# Patient Record
Sex: Female | Born: 1970
Health system: Southern US, Community
[De-identification: ages and names within clinical notes are randomized; demographics above are authoritative.]

---

## 2019-11-07 ENCOUNTER — Encounter (HOSPITAL_COMMUNITY): Payer: Self-pay | Admitting: Emergency Medicine

## 2019-11-07 ENCOUNTER — Other Ambulatory Visit: Payer: Self-pay

## 2019-11-07 ENCOUNTER — Emergency Department (HOSPITAL_COMMUNITY): Payer: Self-pay

## 2019-11-07 ENCOUNTER — Emergency Department (HOSPITAL_COMMUNITY)
Admission: EM | Admit: 2019-11-07 | Discharge: 2019-11-07 | Disposition: A | Discharge status: Home / Self Care | Payer: Self-pay | Attending: Emergency Medicine | Admitting: Emergency Medicine

## 2019-11-07 DIAGNOSIS — R55 Syncope and collapse: Secondary | ICD-10-CM | POA: Insufficient documentation

## 2019-11-07 DIAGNOSIS — R079 Chest pain, unspecified: Secondary | ICD-10-CM

## 2019-11-07 DIAGNOSIS — R002 Palpitations: Secondary | ICD-10-CM | POA: Insufficient documentation

## 2019-11-07 LAB — CBC
HCT: 42.7 % (ref 36.0–46.0)
Hemoglobin: 13.9 g/dL (ref 12.0–15.0)
MCH: 29 pg (ref 26.0–34.0)
MCHC: 32.6 g/dL (ref 30.0–36.0)
MCV: 89.1 fL (ref 80.0–100.0)
Platelets: 280 10*3/uL (ref 150–400)
RBC: 4.79 MIL/uL (ref 3.87–5.11)
RDW: 11.9 % (ref 11.5–15.5)
WBC: 6.7 10*3/uL (ref 4.0–10.5)
nRBC: 0 % (ref 0.0–0.2)

## 2019-11-07 LAB — D-DIMER, QUANTITATIVE: D-Dimer, Quant: 0.3 ug/mL-FEU (ref 0.00–0.50)

## 2019-11-07 LAB — I-STAT BETA HCG BLOOD, ED (MC, WL, AP ONLY): I-stat hCG, quantitative: <5 m[IU]/mL (ref <5)

## 2019-11-07 LAB — BASIC METABOLIC PANEL
Anion gap: 10 (ref 5–15)
BUN: 6 mg/dL (ref 6–20)
CO2: 25 mmol/L (ref 22–32)
Calcium: 9.4 mg/dL (ref 8.9–10.3)
Chloride: 106 mmol/L (ref 98–111)
Creatinine, Ser: 0.68 mg/dL (ref 0.44–1.00)
GFR calc Af Amer: >60 mL/min (ref >60)
GFR calc non Af Amer: >60 mL/min (ref >60)
Glucose, Bld: 105 mg/dL — ABNORMAL HIGH (ref 70–99)
Potassium: 4.6 mmol/L (ref 3.5–5.1)
Sodium: 141 mmol/L (ref 135–145)

## 2019-11-07 LAB — URINALYSIS, ROUTINE W REFLEX MICROSCOPIC
Bilirubin Urine: NEGATIVE
Glucose, UA: NEGATIVE mg/dL
Hgb urine dipstick: NEGATIVE
Ketones, ur: NEGATIVE mg/dL
Leukocytes,Ua: NEGATIVE
Nitrite: NEGATIVE
Protein, ur: NEGATIVE mg/dL
Specific Gravity, Urine: 1.003 — ABNORMAL LOW (ref 1.005–1.030)
pH: 7 (ref 5.0–8.0)

## 2019-11-07 LAB — TROPONIN I (HIGH SENSITIVITY): Troponin I (High Sensitivity): 2 ng/L (ref <18)

## 2019-11-07 LAB — CBG MONITORING, ED: Glucose-Capillary: 90 mg/dL (ref 70–99)

## 2019-11-07 MED ORDER — SODIUM CHLORIDE 0.9% FLUSH: 3.0000 mL | Freq: Once | INTRAVENOUS | Status: DC

## 2019-11-07 NOTE — ED Notes (Signed)
Patient transported to X-ray 

## 2019-11-07 NOTE — ED Provider Notes (Signed)
[facility] EMERGENCY DEPARTMENT Provider Note   CSN: [id number] Arrival date & time: 11/07/19  1213     History Chief Complaint  Patient presents with  . Chest Pain  . syncopal episode    Elizabeth Solis is a 49 y.o. female who presents to the ED today complaining of gradual onset, intermittent, feelings of heart fluttering/"shaking in my chest." x 1 day. Pt also reports near syncopal episode today however triage report states full syncopal episode -reports she was in the shower when she felt suddenly lightheaded sat down on the floor.  She denies head injury or loss of consciousness.  She does not think she fully lost consciousness as she thinks she made a conscious decision to sit down versus she feels like if she actually passed out she would have gone through the shower door and broke the glass.  She reports that she was concerned with her near syncopal episode today and she does not want her husband to be concerned.  He states that when she feels like her heart is fluttering she feels like her bilateral arms go "cold" over she denies any tingling, numbness, weakness in her arms.  No one sided weakness.  Denies headache, vision changes, shortness of breath, diaphoresis, leg swelling, nausea, vomiting, any other associated symptoms.  History of DVT/PE.  No recent prolonged travel or immobilization.  No active malignancy.  No hemoptysis.  No exogenous hormone use.  Does report she has a history of Hashimoto's disease however has been off of her medications for some time as her pharmacy stopped caring the specific medication.   The history is provided by the patient.       History reviewed. No pertinent past medical history.  There are no problems to display for this patient.   History reviewed. No pertinent surgical history.   OB History   No obstetric history on file.     No family history on file.  Social History   Tobacco Use  . Smoking status: Never  Smoker  . Smokeless tobacco: Never Used  Substance Use Topics  . Alcohol use: Not Currently  . Drug use: Never    Home Medications Prior to Admission medications   Medication Sig Start Date End Date Taking? Authorizing Provider  Multiple Vitamin (MULTIVITAMIN) LIQD Take 30 mLs by mouth daily.   Yes [provider]  Pyrantel Pamoate ([facility] PINWORM TREATMENT PO) Take 1 tablet by mouth once.   Yes [provider]    Allergies    Miconazole  Review of Systems   Review of Systems  Constitutional: Negative for chills and fever.  Respiratory: Negative for cough and shortness of breath.   Cardiovascular: Positive for palpitations.  Gastrointestinal: Negative for abdominal pain, constipation, diarrhea, nausea and vomiting.  Neurological: Negative for dizziness, syncope, weakness, light-headedness and numbness.  All other systems reviewed and are negative.   Physical Exam Updated Vital Signs BP 112/72   Pulse 76   Temp 98.7 F (37.1 C) (Oral)   Resp 17   Ht 5\' 8"  (1.727 m)   Wt 88.5 kg   LMP 11/01/2019   SpO2 97%   BMI 29.65 kg/m   Physical Exam Vitals and nursing note reviewed.  Constitutional:      Appearance: She is not ill-appearing.  HENT:     Head: Normocephalic and atraumatic.  Eyes:     Extraocular Movements: Extraocular movements intact.     Conjunctiva/sclera: Conjunctivae normal.     Pupils: Pupils  are equal, round, and reactive to light.  Cardiovascular:     Rate and Rhythm: Normal rate and regular rhythm.     Pulses:          Radial pulses are 2+ on the right side and 2+ on the left side.       Dorsalis pedis pulses are 2+ on the right side and 2+ on the left side.  Pulmonary:     Effort: Pulmonary effort is normal.     Breath sounds: Normal breath sounds. No decreased breath sounds, wheezing, rhonchi or rales.  Chest:     Chest wall: No tenderness.  Abdominal:     Palpations: Abdomen is soft.     Tenderness: There is no abdominal  tenderness. There is no guarding or rebound.  Musculoskeletal:     Cervical back: Neck supple.     Right lower leg: No edema.     Left lower leg: No edema.  Skin:    General: Skin is warm and dry.  Neurological:     Mental Status: She is alert.     Comments: CN 3-12 grossly intact A&O x4 GCS 15 Sensation and strength intact Gait nonataxic including with tandem walking Coordination with finger-to-nose WNL Neg romberg, neg pronator drift     ED Results / Procedures / Treatments   Labs (all labs ordered are listed, but only abnormal results are displayed) Labs Reviewed  BASIC METABOLIC PANEL - Abnormal; Notable for the following components:      Result Value   Glucose, Bld 105 (*)    All other components within normal limits  URINALYSIS, ROUTINE W REFLEX MICROSCOPIC - Abnormal; Notable for the following components:   Color, Urine STRAW (*)    Specific Gravity, Urine 1.003 (*)    All other components within normal limits  CBC  D-DIMER, QUANTITATIVE (NOT AT [facility])  I-STAT BETA HCG BLOOD, ED ([facility], [facility], [facility] ONLY)  CBG MONITORING, ED  TROPONIN I (HIGH SENSITIVITY)    EKG EKG Interpretation  Date/Time:  Sunday November 07 2019 12:34:09 EST Ventricular Rate:  86 PR Interval:  146 QRS Duration: 70 QT Interval:  382 QTC Calculation: 457 R Axis:   77 Text Interpretation: Normal sinus rhythm Normal ECG Confirmed by Cathren Laine ([id number]) on 11/07/2019 12:46:31 PM   Radiology DG Chest 2 View  Result Date: 11/07/2019 CLINICAL DATA:  Chest pain EXAM: CHEST - 2 VIEW COMPARISON:  None. FINDINGS: The heart size and mediastinal contours are within normal limits. Both lungs are clear. The visualized skeletal structures are unremarkable. IMPRESSION: No active cardiopulmonary disease. Electronically Signed   By: Alcide Clever M.D.   On: 11/07/2019 13:23    Procedures Procedures (including critical care time)  Medications Ordered in ED Medications  sodium chloride flush (NS) 0.9 %  injection 3 mL (3 mLs Intravenous Not Given 11/07/19 1558)    ED Course  I have reviewed the triage vital signs and the nursing notes.  Pertinent labs & imaging results that were available during my care of the patient were reviewed by me and considered in my medical decision making (see chart for details).  Clinical Course as of Nov 06 1601  Sun Nov 07, 2019  1331 DG Chest 2 View [MV]  1410 D-Dimer, Quant: 0.30 [MV]    Clinical Course User Index [MV] Tanda Rockers, [place]   49 year old female who presents to the ED today complaining of feelings of heart fluttering lately for the past day and also a near syncopal  episode today which concerned her.  On arrival to the ED patient is afebrile, nontachycardic and nontachypneic.  She is denying active chest pain.  No shortness of breath or other concerning symptoms for ACS.  Patient resting comfortably in the room however states that she feels like her heart is beating fast despite heart rate in the 70s and 80s.  Has no significant risk factors for PE.  Focal neuro deficits on exam today.  Will work up for heart fluttering etiologies including electrolyte disturbances, EKG obtained, will work up for ACS. D dimer added given complaint of palpitations. Will obtain orthostatics as well.   EKG without ischemic changes today.  CXR negative.  CBC without leukocytosis. Hgb stable.  BMP without electrolyte abnormalities. Creatinine within normal limits.  Troponin of 2; given no complaint of chest pain and low suspicion for ACS do not feel pt needs repeat troponin  D dimer negative.  U/A without infection.   Orthostatics within normal limits.   Labwork reassuring today. During ED visit pt does mention that she has a hx of hashimoto's but has been off of medicines for quite some time as her pharmacy did not carry her medicine anymore and she does not like taking medications on a daily basis. This could certainly be causing pt's symptoms. I would like her  to follow up with cardiology given complaints, may benefit from holter monitor. Will place ambulatory referral. Have advised to follow up with her PCP as well. [facility] information given. Pt encouraged to increase oral intake as she may have some aspect of dehydration as well although cannot rule out cardiac arythmias playing a part which is why she needs to follow up with cardiology. Strict return precautions discussed. Pt is in agreement with plan and stable for discharge home.   This note was prepared using Dragon voice recognition software and may include unintentional dictation errors due to the inherent limitations of voice recognition software.   MDM Rules/Calculators/A&P                       Final Clinical Impression(s) / ED Diagnoses Final diagnoses:  Heart palpitations  Nonspecific chest pain  Near syncope    Rx / DC Orders ED Discharge Orders         Ordered    Ambulatory referral to Cardiology     11/07/19 1548           Discharge Instructions     Your labwork, EKG, and chest xray were very reassuring today  I have placed a referral to cardiology for you - they will call to schedule an appointment.   Please follow up with your PCP as well; if you dont have one you can follow up with [facility] for primary care needs. They may need to check your thyroid levels given you have been off of your medications and check some other labwork as well   Return to the ED for any worsening symptoms including worsening palpitations, feeling you can't breathe/shortness of breath, recurrent chest pain, vomiting, continuously passing out, blurry vision/double vision, numbness or weakness on one side of your body, or any other concerning symptoms.        Eustaquio Maize, PA-C 11/07/19 1609    Lajean Saver, MD 11/08/19 [phone]

## 2019-11-07 NOTE — Discharge Instructions (Signed)
Your labwork, EKG, and chest xray were very reassuring today  I have placed a referral to cardiology for you - they will call to schedule an appointment.   Please follow up with your PCP as well; if you dont have one you can follow up with [facility] for primary care needs. They may need to check your thyroid levels given you have been off of your medications and check some other labwork as well   Return to the ED for any worsening symptoms including worsening palpitations, feeling you can't breathe/shortness of breath, recurrent chest pain, vomiting, continuously passing out, blurry vision/double vision, numbness or weakness on one side of your body, or any other concerning symptoms.

## 2019-11-07 NOTE — ED Notes (Signed)
Pt reports taking medication for pinworms approx. 1-2 weeks ago, inquiring if this could cause symptoms. Provider made aware.

## 2019-11-07 NOTE — ED Triage Notes (Addendum)
Pt states "it feels like someone on my heart shaking it" intermittently since yesterday.  States it is worse today.  Pt tearful and states she had syncopal episode while in shower today and "passed out" for a few minutes.  Pt states she does not want her husband to know about it.  Reports tightness to center of back that started on the way to the hospital.  Also reports stiffness to neck.  Pt refuses c-collar at triage.

## 2019-11-22 NOTE — Progress Notes (Deleted)
    Cardiology Office Note  Date: 11/22/2019   ID: Elizabeth Solis, DOB 1970/10/04, MRN [id number]  PCP:  Patient, No Pcp Per  Consulting Cardiologist: Jonelle Sidle, MD Electrophysiologist:  None   No chief complaint on file.   History of Present Illness: Elizabeth Solis is a 49 y.o. female referred for card iology consultation by Ms. Hyman Hopes PA-C for the evaluation of palpitations.  I reviewed the ER notes from February 14.  Work-up was unrevealing for cardiac arrhythmia.  She had normal D-dimer and high-sensitivity troponin I levels, chest x-ray unremarkable, no arrhythmia by telemetry or ECG, orthostatics negative.  No past medical history on file.  No past surgical history on file.  Current Outpatient Medications  Medication Sig Dispense Refill  . Multiple Vitamin (MULTIVITAMIN) LIQD Take 30 mLs by mouth daily.    . Pyrantel Pamoate ([facility] PINWORM TREATMENT PO) Take 1 tablet by mouth once.     No current facility-administered medications for this visit.   Allergies:  Miconazole   Social History: The patient  reports that she has never smoked. She has never used smokeless tobacco. She reports previous alcohol use. She reports that she does not use drugs.   Family History: The patient's family history is not on file.   ROS:  Please see the history of present illness. Otherwise, complete review of systems is positive for {NONE DEFAULTED:18576::"none"}.  All other systems are reviewed and negative.   Physical Exam: VS:  LMP 11/01/2019 , BMI There is no height or weight on file to calculate BMI.  Wt Readings from Last 3 Encounters:  11/07/19 195 lb (88.5 kg)    General: Patient appears comfortable at rest. HEENT: Conjunctiva and lids normal, oropharynx clear with moist mucosa. Neck: Supple, no elevated JVP or carotid bruits, no thyromegaly. Lungs: Clear to auscultation, nonlabored breathing at rest. Cardiac: Regular rate and rhythm, no S3 or significant systolic  murmur, no pericardial rub. Abdomen: Soft, nontender, no hepatomegaly, bowel sounds present, no guarding or rebound. Extremities: No pitting edema, distal pulses 2+. Skin: Warm and dry. Musculoskeletal: No kyphosis. Neuropsychiatric: Alert and oriented x3, affect grossly appropriate.  ECG:  An ECG dated 11/07/2019 was personally reviewed today and demonstrated:  Normal sinus rhythm.  Recent Labwork: 11/07/2019: BUN 6; Creatinine, Ser 0.68; Hemoglobin 13.9; Platelets 280; Potassium 4.6; Sodium 141, high-sensitivity troponin I 2, D-dimer 0.30, hCG negative  Other Studies Reviewed Today:  Chest x-ray 11/07/2019: FINDINGS: The heart size and mediastinal contours are within normal limits. Both lungs are clear. The visualized skeletal structures are unremarkable.  IMPRESSION: No active cardiopulmonary disease.  Assessment and Plan:   Medication Adjustments/Labs and Tests Ordered: Current medicines are reviewed at length with the patient today.  Concerns regarding medicines are outlined above.   Tests Ordered: No orders of the defined types were placed in this encounter.   Medication Changes: No orders of the defined types were placed in this encounter.   Disposition:  Follow up {follow up:15908}  Signed, Jonelle Sidle, MD, [facility] 11/22/2019 11:52 AM    [facility] at [facility] [address], [place], [place] [address] Phone: [phone]; Fax: [phone]

## 2019-11-23 ENCOUNTER — Ambulatory Visit: Payer: Self-pay | Admitting: Cardiology

## 2019-11-23 ENCOUNTER — Encounter: Payer: Self-pay | Admitting: Cardiology

## 2019-12-07 ENCOUNTER — Encounter: Payer: Self-pay | Admitting: Medical

## 2020-11-08 IMAGING — DX DG CHEST 2V
2 series · 2 of 2 positions shown · non-contrast
Comparison: None.

CLINICAL DATA: Chest pain

EXAM:
CHEST - 2 VIEW

[w chest pa]
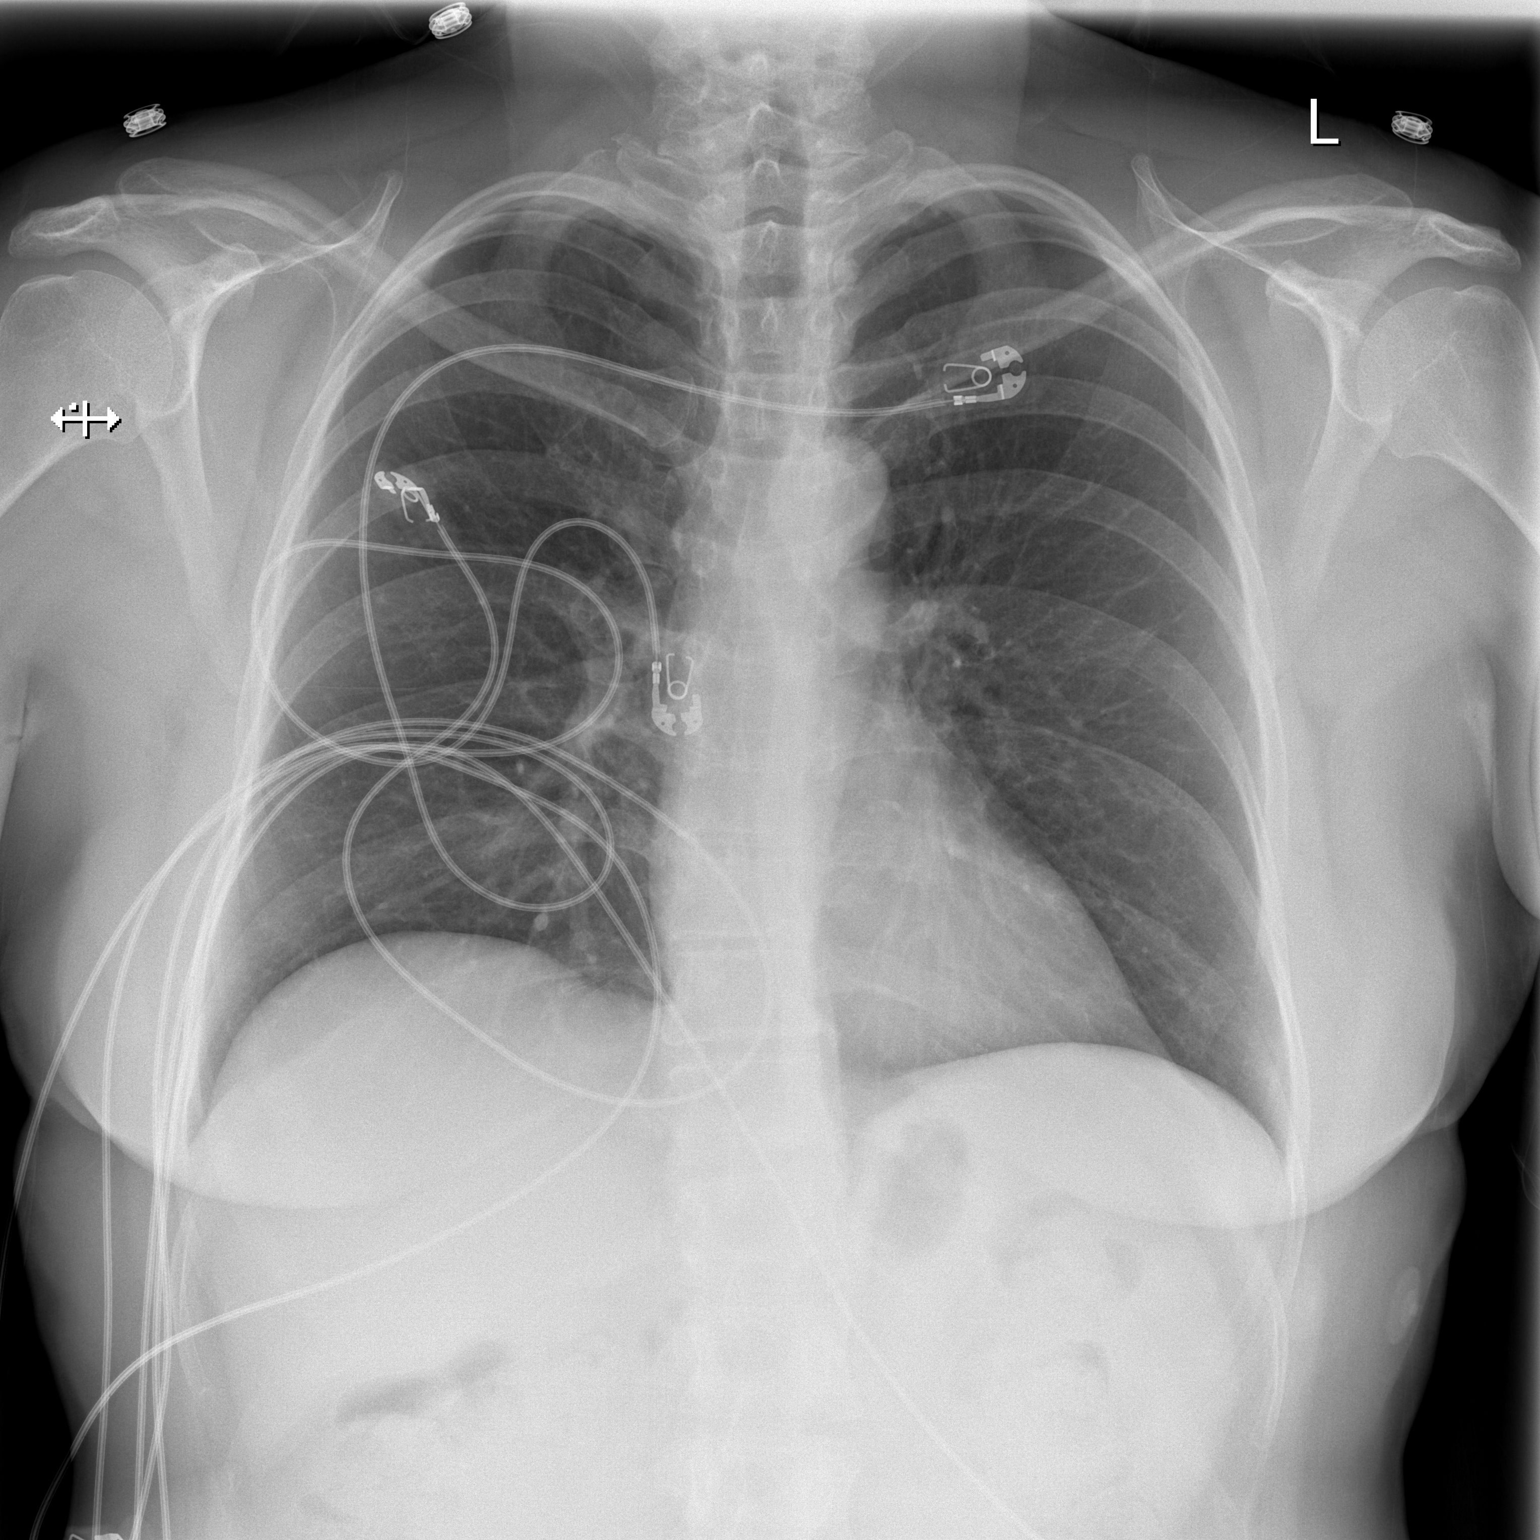

[w chest lat]
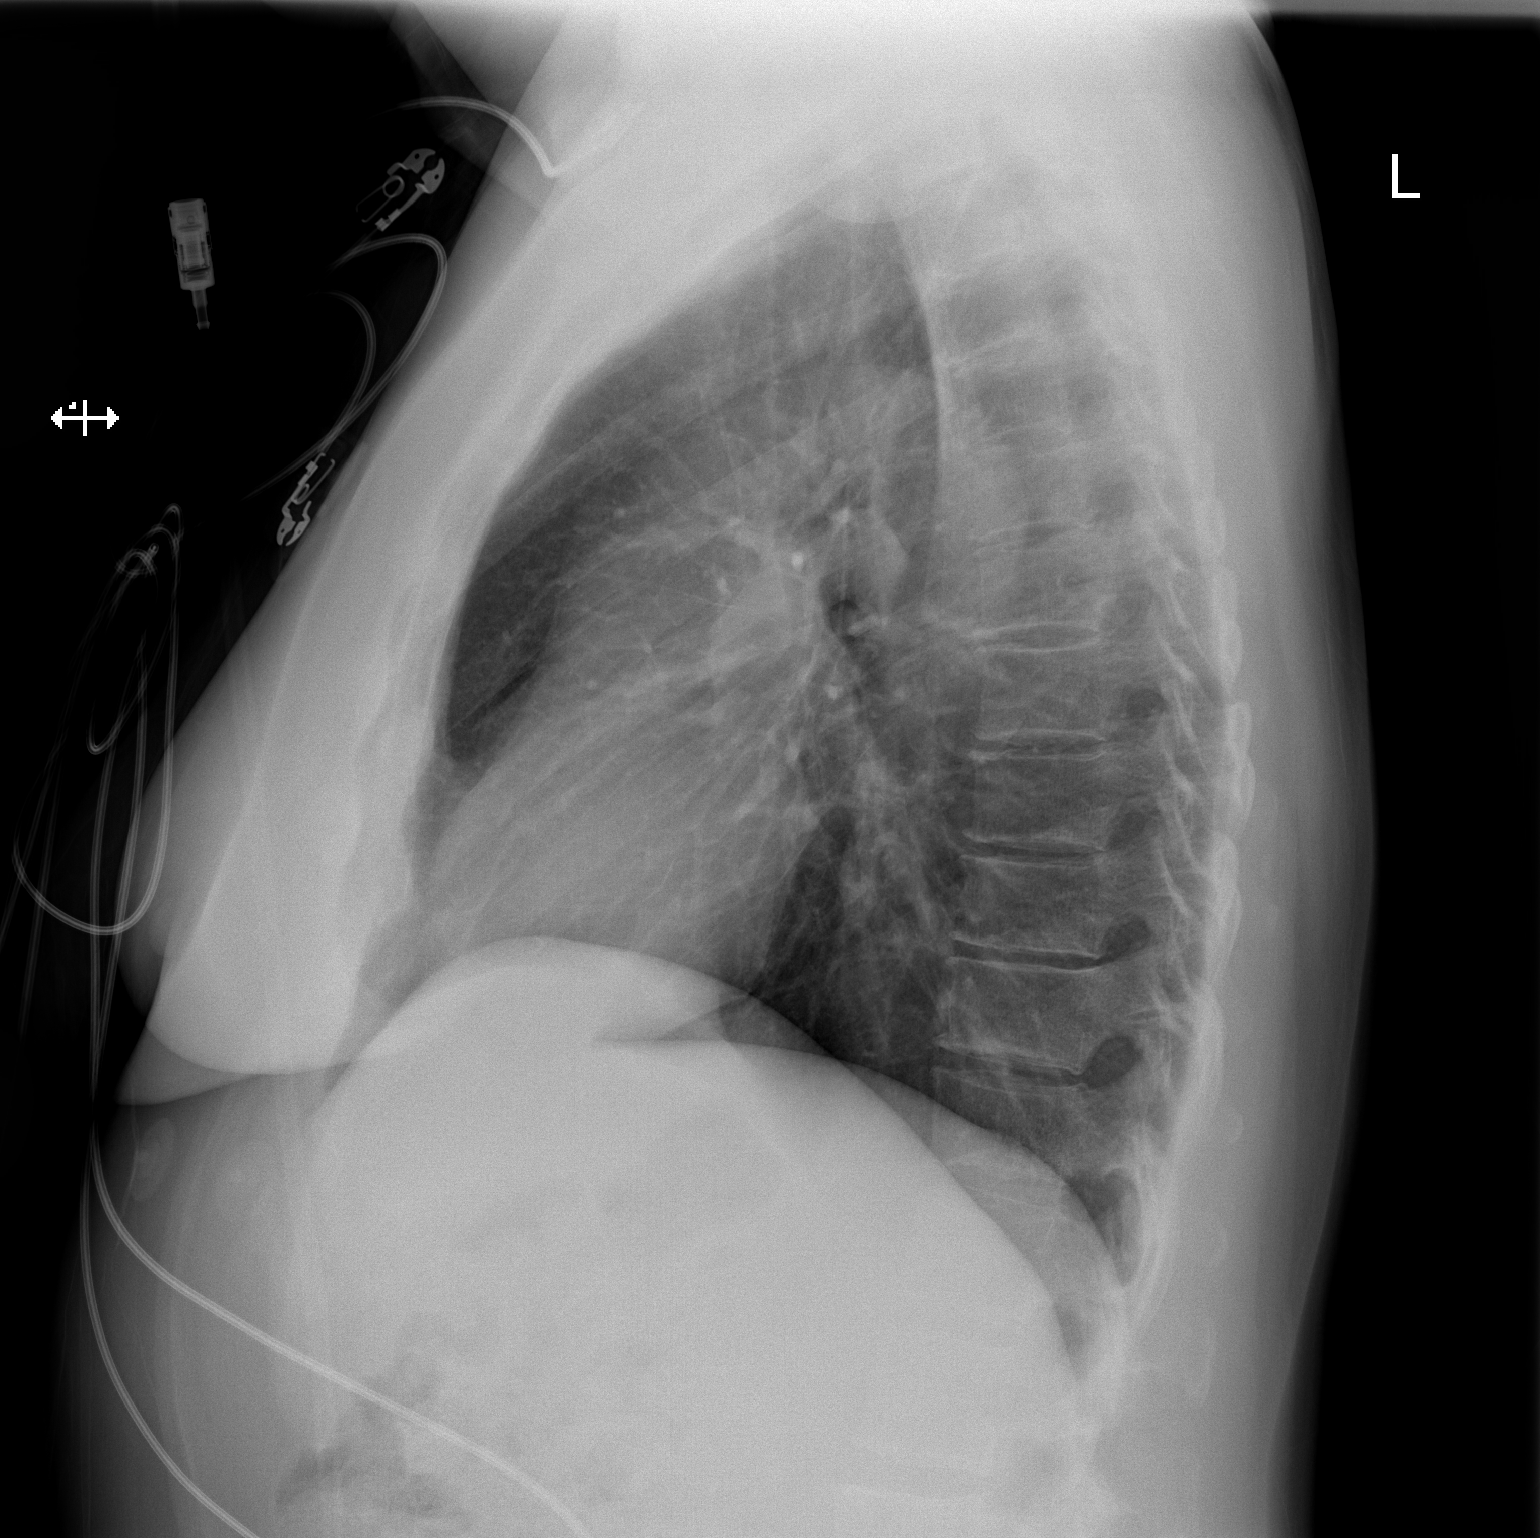

[2 of 2 positions shown; findings below may reference images not displayed]

FINDINGS: The heart size and mediastinal contours are within normal limits.
Both lungs are clear. The visualized skeletal structures are
unremarkable.
IMPRESSION: No active cardiopulmonary disease.

## 2021-07-04 ENCOUNTER — Ambulatory Visit: Payer: Self-pay | Admitting: Orthopaedic Surgery
